# Patient Record
Sex: Male | Born: 1995 | Race: White | Hispanic: No | Marital: Single
Health system: Southern US, Community
[De-identification: ages and names within clinical notes are randomized; demographics above are authoritative.]

---
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
## 2019-03-22 ENCOUNTER — Other Ambulatory Visit: Payer: Self-pay | Admitting: Internal Medicine

## 2019-03-22 DIAGNOSIS — Z20822 Contact with and (suspected) exposure to covid-19: Secondary | ICD-10-CM

## 2019-03-24 LAB — NOVEL CORONAVIRUS, NAA: SARS-CoV-2, NAA: NOT DETECTED

## 2019-06-01 ENCOUNTER — Other Ambulatory Visit: Payer: Self-pay

## 2019-06-01 ENCOUNTER — Emergency Department
Admission: EM | Admit: 2019-06-01 | Discharge: 2019-06-01 | Disposition: A | Discharge status: Home / Self Care | Payer: BC Managed Care – PPO | Attending: Emergency Medicine | Admitting: Emergency Medicine

## 2019-06-01 DIAGNOSIS — R109 Unspecified abdominal pain: Secondary | ICD-10-CM | POA: Diagnosis present

## 2019-06-01 DIAGNOSIS — E109 Type 1 diabetes mellitus without complications: Secondary | ICD-10-CM | POA: Insufficient documentation

## 2019-06-01 DIAGNOSIS — R112 Nausea with vomiting, unspecified: Secondary | ICD-10-CM | POA: Insufficient documentation

## 2019-06-01 LAB — COMPREHENSIVE METABOLIC PANEL
ALT: 17 U/L (ref 0–44)
AST: 22 U/L (ref 15–41)
Albumin: 4.5 g/dL (ref 3.5–5.0)
Alkaline Phosphatase: 57 U/L (ref 38–126)
Anion gap: 10 (ref 5–15)
BUN: 15 mg/dL (ref 6–20)
CO2: 24 mmol/L (ref 22–32)
Calcium: 9.2 mg/dL (ref 8.9–10.3)
Chloride: 102 mmol/L (ref 98–111)
Creatinine, Ser: 0.99 mg/dL (ref 0.61–1.24)
GFR calc Af Amer: >60 mL/min (ref >60)
GFR calc non Af Amer: >60 mL/min (ref >60)
Glucose, Bld: 232 mg/dL — ABNORMAL HIGH (ref 70–99)
Potassium: 3.9 mmol/L (ref 3.5–5.1)
Sodium: 136 mmol/L (ref 135–145)
Total Bilirubin: 1 mg/dL (ref 0.3–1.2)
Total Protein: 7 g/dL (ref 6.5–8.1)

## 2019-06-01 LAB — URINALYSIS, COMPLETE (UACMP) WITH MICROSCOPIC
Bacteria, UA: NONE SEEN
Glucose, UA: 150 mg/dL — AB
Hgb urine dipstick: NEGATIVE
Ketones, ur: 20 mg/dL — AB
Leukocytes,Ua: NEGATIVE
Nitrite: NEGATIVE
Protein, ur: 100 mg/dL — AB
Specific Gravity, Urine: 1.03 (ref 1.005–1.030)
pH: 7 (ref 5.0–8.0)

## 2019-06-01 LAB — CBC
HCT: 40.9 % (ref 39.0–52.0)
Hemoglobin: 13.5 g/dL (ref 13.0–17.0)
MCH: 27.6 pg (ref 26.0–34.0)
MCHC: 33 g/dL (ref 30.0–36.0)
MCV: 83.6 fL (ref 80.0–100.0)
Platelets: 288 10*3/uL (ref 150–400)
RBC: 4.89 MIL/uL (ref 4.22–5.81)
RDW: 12.7 % (ref 11.5–15.5)
WBC: 7.9 10*3/uL (ref 4.0–10.5)
nRBC: 0 % (ref 0.0–0.2)

## 2019-06-01 NOTE — Discharge Instructions (Addendum)
Return to the ER for new, worsening, or persistent severe nausea or vomiting, abdominal pain, fever, weakness, or any other new or worsening symptoms that concern you.

## 2019-06-01 NOTE — ED Provider Notes (Signed)
[facility] Emergency Department Provider Note ____________________________________________   First MD Initiated Contact with Patient 06/01/19 2108     (approximate)  I have reviewed the triage vital signs and the nursing notes.   HISTORY  Chief Complaint Abdominal Pain    HPI Rashun Mckeough is a 23 y.o. male with a PMH of type 1 diabetes who presents with nausea, acute onset about 1 hour prior to coming to the ER, occurring while he was eating, and associated with upper abdominal discomfort.  The patient states he felt like there was a fullness, and he felt lightheaded.  He had one episode of vomiting with no relief.  After being in the ER for some time, he had a bowel movement and his symptoms have now resolved.  He was able to drink some water.  He states that he was feeling fine earlier today.  He denies eating anything unusual.  He has no sick contacts.  No past medical history on file.  There are no active problems to display for this patient.     Prior to Admission medications   Not on File    Allergies Patient has no known allergies.  No family history on file.  Social History Social History   Tobacco Use  . Smoking status: Not on file  Substance Use Topics  . Alcohol use: Not on file  . Drug use: Not on file    Review of Systems  Constitutional: No fever. Eyes: No redness. ENT: No sore throat. Cardiovascular: Denies chest pain. Respiratory: Denies shortness of breath. Gastrointestinal: Positive for nausea and vomiting. Genitourinary: Negative for dysuria.  Musculoskeletal: Negative for back pain. Skin: Negative for rash. Neurological: Negative for headache.   ____________________________________________   PHYSICAL EXAM:  VITAL SIGNS: ED Triage Vitals  Enc Vitals Group     BP 06/01/19 2036 (!) 91/55     Pulse Rate 06/01/19 2036 (!) 103     Resp 06/01/19 2036 16     Temp 06/01/19 2036 98.3 F (36.8 C)     Temp  Source 06/01/19 2036 Oral     SpO2 06/01/19 2036 99 %     Weight 06/01/19 2036 175 lb (79.4 kg)     Height 06/01/19 2036 5\' 8"  (1.727 m)     Head Circumference --      Peak Flow --      Pain Score 06/01/19 2037 8     Pain Loc --      Pain Edu? --      Excl. in [place]? --     Constitutional: Alert and oriented. Well appearing and in no acute distress. Eyes: Conjunctivae are normal.  EOMI.  No scleral icterus. Head: Atraumatic. Nose: No congestion/rhinnorhea. Mouth/Throat: Mucous membranes are moist.   Neck: Normal range of motion.  Cardiovascular: Good peripheral circulation. Respiratory: Normal respiratory effort.  No retractions. Gastrointestinal: Soft and nontender. No distention.  Genitourinary: No flank tenderness. Musculoskeletal:  Extremities warm and well perfused.  Neurologic:  Normal speech and language. No gross focal neurologic deficits are appreciated.  Skin:  Skin is warm and dry. No rash noted. Psychiatric: Mood and affect are normal. Speech and behavior are normal.  ____________________________________________   LABS (all labs ordered are listed, but only abnormal results are displayed)  Labs Reviewed  COMPREHENSIVE METABOLIC PANEL - Abnormal; Notable for the following components:      Result Value   Glucose, Bld 232 (*)    All other components within normal limits  URINALYSIS, COMPLETE (UACMP) WITH MICROSCOPIC - Abnormal; Notable for the following components:   Color, Urine AMBER (*)    APPearance HAZY (*)    Glucose, UA 150 (*)    Bilirubin Urine SMALL (*)    Ketones, ur 20 (*)    Protein, ur 100 (*)    All other components within normal limits  CBC   ____________________________________________  EKG   ____________________________________________  RADIOLOGY    ____________________________________________   PROCEDURES  Procedure(s) performed: No  Procedures  Critical Care performed: No ____________________________________________    INITIAL IMPRESSION / ASSESSMENT AND PLAN / ED COURSE  Pertinent labs & imaging results that were available during my care of the patient were reviewed by me and considered in my medical decision making (see chart for details).  23 year old male with history of diabetes presents with acute onset of nausea, abdominal discomfort, and lightheadedness, with one episode of vomiting prior to coming to the ED.  After he was in the ED room he had a bowel movement and his symptoms have now resolved.  He is tolerating p.o.  On exam the patient is well-appearing and his vital signs are normal.  The abdomen is soft and nontender.  Overall presentation is consistent with a benign etiology such as gastritis, gastroparesis, or possible early gastroenteritis or foodborne illness.  Lab work-up was obtained and is unremarkable except for slightly elevated glucose consistent with the patient's known diabetes.  At this time, the patient states he feels fine and would like to go home.  I offered for him to get a liter of IV fluid and be observed for about 1 to 2 hours to make sure his symptoms do not come back, but he declines.  I think that this is reasonable.  I gave the patient thorough return precautions.  He is stable for discharge home at this time.  ____________________________________________   FINAL CLINICAL IMPRESSION(S) / ED DIAGNOSES  Final diagnoses:  Non-intractable vomiting with nausea, unspecified vomiting type      NEW MEDICATIONS STARTED DURING THIS VISIT:  There are no discharge medications for this patient.    Note:  This document was prepared using Dragon voice recognition software and may include unintentional dictation errors.   Arta Silence, MD 06/01/19 2224

## 2019-06-01 NOTE — ED Triage Notes (Signed)
Pt states abd  Pain with vomiting that began one hour ago. Pt states this am he did have a headache, but it is gone. Pt denies diarrhea, denies known fever.

## 2019-06-01 NOTE — ED Triage Notes (Signed)
Pt pale, sweaty.

## 2022-08-10 ENCOUNTER — Ambulatory Visit: Payer: BC Managed Care – PPO | Admitting: Dermatology

## 2022-08-10 DIAGNOSIS — L814 Other melanin hyperpigmentation: Secondary | ICD-10-CM

## 2022-08-10 DIAGNOSIS — D229 Melanocytic nevi, unspecified: Secondary | ICD-10-CM

## 2022-08-10 DIAGNOSIS — Z1283 Encounter for screening for malignant neoplasm of skin: Secondary | ICD-10-CM | POA: Diagnosis not present

## 2022-08-10 DIAGNOSIS — D2222 Melanocytic nevi of left ear and external auricular canal: Secondary | ICD-10-CM

## 2022-08-10 DIAGNOSIS — L578 Other skin changes due to chronic exposure to nonionizing radiation: Secondary | ICD-10-CM

## 2022-08-10 DIAGNOSIS — D225 Melanocytic nevi of trunk: Secondary | ICD-10-CM | POA: Diagnosis not present

## 2022-08-10 DIAGNOSIS — Z808 Family history of malignant neoplasm of other organs or systems: Secondary | ICD-10-CM

## 2022-08-10 NOTE — Progress Notes (Signed)
   New Patient Visit  Subjective  Lonnie Patel is a 26 y.o. male who presents for the following: Total body skin exam (No hx of skin cancer, Paternal Grandfather w/ hx of Skin cancer). The patient presents for Total-Body Skin Exam (TBSE) for skin cancer screening and mole check.  The patient has spots, moles and lesions to be evaluated, some may be new or changing and the patient has concerns that these could be cancer.   The following portions of the chart were reviewed this encounter and updated as appropriate:   Allergies  Meds  Problems  Med Hx  Surg Hx  Fam Hx     Review of Systems:  No other skin or systemic complaints except as noted in HPI or Assessment and Plan.  Objective  Well appearing patient in no apparent distress; mood and affect are within normal limits.  A full examination was performed including scalp, head, eyes, ears, nose, lips, neck, chest, axillae, abdomen, back, buttocks, bilateral upper extremities, bilateral lower extremities, hands, feet, fingers, toes, fingernails, and toenails. All findings within normal limits unless otherwise noted below.  L earlobe; Right Ear; Right Upper Back; Left Lower Back, L lat abdomen, R flank  Right Ear 1.42m brown macule  L earlobe Brown macule  Right Upper Back 0.5cm brown macule  Left Lower Back, L lat abdomen, R flank Brown macules                    Assessment & Plan  Nevus (4) L earlobe; Right Ear; Right Upper Back; Left Lower Back, L lat abdomen, R flank See photos Benign-appearing.  Observation.  Call clinic for new or changing moles.  Recommend daily use of broad spectrum spf 30+ sunscreen to sun-exposed areas.    Family history of skin cancer - what type(s): not sure - who affected: Paternal grandfather  Melanocytic Nevi - Tan-brown and/or pink-flesh-colored symmetric macules and papules - Benign appearing on exam today - Observation - Call clinic for new or changing moles -  Recommend daily use of broad spectrum spf 30+ sunscreen to sun-exposed areas.  - trunk  Actinic Damage - Chronic condition, secondary to cumulative UV/sun exposure - diffuse scaly erythematous macules with underlying dyspigmentation - Recommend daily broad spectrum sunscreen SPF 30+ to sun-exposed areas, reapply every 2 hours as needed.  - Staying in the shade or wearing long sleeves, sun glasses (UVA+UVB protection) and wide brim hats (4-inch brim around the entire circumference of the hat) are also recommended for sun protection.  - Call for new or changing lesions.  Skin cancer screening performed today.   Return in about 1 year (around 08/11/2023) for TBSE.  I, SOthelia Pulling RMA, am acting as scribe for DSarina Ser MD .  Documentation: I have reviewed the above documentation for accuracy and completeness, and I agree with the above.  DSarina Ser MD

## 2022-08-10 NOTE — Patient Instructions (Signed)
Due to recent changes in healthcare laws, you may see results of your pathology and/or laboratory studies on MyChart before the doctors have had a chance to review them. We understand that in some cases there may be results that are confusing or concerning to you. Please understand that not all results are received at the same time and often the doctors may need to interpret multiple results in order to provide you with the best plan of care or course of treatment. Therefore, we ask that you please give us 2 business days to thoroughly review all your results before contacting the office for clarification. Should we see a critical lab result, you will be contacted sooner.   If You Need Anything After Your Visit  If you have any questions or concerns for your doctor, please call our main line at [phone] and press option 4 to reach your doctor's medical assistant. If no one answers, please leave a voicemail as directed and we will return your call as soon as possible. Messages left after 4 pm will be answered the following business day.   You may also send us a message via MyChart. We typically respond to MyChart messages within 1-2 business days.  For prescription refills, please ask your pharmacy to contact our office. Our fax number is [phone].  If you have an urgent issue when the clinic is closed that cannot wait until the next business day, you can page your doctor at the number below.    Please note that while we do our best to be available for urgent issues outside of office hours, we are not available 24/7.   If you have an urgent issue and are unable to reach us, you may choose to seek medical care at your doctor's office, retail clinic, urgent care center, or emergency room.  If you have a medical emergency, please immediately call 911 or go to the emergency department.  Pager Numbers  - Dr. Kowalski: [phone]  - Dr. Moye: [phone]  - Dr. Stewart:  [phone]  In the event of inclement weather, please call our main line at [phone] for an update on the status of any delays or closures.  Dermatology Medication Tips: Please keep the boxes that topical medications come in in order to help keep track of the instructions about where and how to use these. Pharmacies typically print the medication instructions only on the boxes and not directly on the medication tubes.   If your medication is too expensive, please contact our office at [phone] option 4 or send us a message through MyChart.   We are unable to tell what your co-pay for medications will be in advance as this is different depending on your insurance coverage. However, we may be able to find a substitute medication at lower cost or fill out paperwork to get insurance to cover a needed medication.   If a prior authorization is required to get your medication covered by your insurance company, please allow us 1-2 business days to complete this process.  Drug prices often vary depending on where the prescription is filled and some pharmacies may offer cheaper prices.  The website www.goodrx.com contains coupons for medications through different pharmacies. The prices here do not account for what the cost may be with help from insurance (it may be cheaper with your insurance), but the website can give you the price if you did not use any insurance.  - You can print the associated coupon and take it with   your prescription to the pharmacy.  - You may also stop by our office during regular business hours and pick up a GoodRx coupon card.  - If you need your prescription sent electronically to a different pharmacy, notify our office through [place] MyChart or by phone at [phone] option 4.     Si Usted Necesita Algo Despus de Su Visita  Tambin puede enviarnos un mensaje a travs de MyChart. Por lo general respondemos a los mensajes de MyChart en el transcurso de 1 a 2  das hbiles.  Para renovar recetas, por favor pida a su farmacia que se ponga en contacto con nuestra oficina. Nuestro nmero de fax es el [phone].  Si tiene un asunto urgente cuando la clnica est cerrada y que no puede esperar hasta el siguiente da hbil, puede llamar/localizar a su doctor(a) al nmero que aparece a continuacin.   Por favor, tenga en cuenta que aunque hacemos todo lo posible para estar disponibles para asuntos urgentes fuera del horario de oficina, no estamos disponibles las 24 horas del da, los 7 das de la semana.   Si tiene un problema urgente y no puede comunicarse con nosotros, puede optar por buscar atencin mdica  en el consultorio de su doctor(a), en una clnica privada, en un centro de atencin urgente o en una sala de emergencias.  Si tiene una emergencia mdica, por favor llame inmediatamente al 911 o vaya a la sala de emergencias.  Nmeros de bper  - Dr. Kowalski: [phone]  - Dra. Moye: [phone]  - Dra. Stewart: [phone]  En caso de inclemencias del tiempo, por favor llame a nuestra lnea principal al [phone] para una actualizacin sobre el estado de cualquier retraso o cierre.  Consejos para la medicacin en dermatologa: Por favor, guarde las cajas en las que vienen los medicamentos de uso tpico para ayudarle a seguir las instrucciones sobre dnde y cmo usarlos. Las farmacias generalmente imprimen las instrucciones del medicamento slo en las cajas y no directamente en los tubos del medicamento.   Si su medicamento es muy caro, por favor, pngase en contacto con nuestra oficina llamando al [phone] y presione la opcin 4 o envenos un mensaje a travs de MyChart.   No podemos decirle cul ser su copago por los medicamentos por adelantado ya que esto es diferente dependiendo de la cobertura de su seguro. Sin embargo, es posible que podamos encontrar un medicamento sustituto a menor costo o llenar un formulario para que el  seguro cubra el medicamento que se considera necesario.   Si se requiere una autorizacin previa para que su compaa de seguros cubra su medicamento, por favor permtanos de 1 a 2 das hbiles para completar este proceso.  Los precios de los medicamentos varan con frecuencia dependiendo del lugar de dnde se surte la receta y alguna farmacias pueden ofrecer precios ms baratos.  El sitio web www.goodrx.com tiene cupones para medicamentos de diferentes farmacias. Los precios aqu no tienen en cuenta lo que podra costar con la ayuda del seguro (puede ser ms barato con su seguro), pero el sitio web puede darle el precio si no utiliz ningn seguro.  - Puede imprimir el cupn correspondiente y llevarlo con su receta a la farmacia.  - Tambin puede pasar por nuestra oficina durante el horario de atencin regular y recoger una tarjeta de cupones de GoodRx.  - Si necesita que su receta se enve electrnicamente a una farmacia diferente, informe a nuestra oficina a travs de MyChart de [place]   o por telfono llamando al [phone] y presione la opcin 4.  

## 2022-08-19 ENCOUNTER — Encounter: Payer: Self-pay | Admitting: Dermatology
